# Patient Record
Sex: Female | Born: 1950 | Race: White | Hispanic: No | Marital: Married | Smoking: Former smoker
Health system: Southern US, Community
[De-identification: ages and names within clinical notes are randomized; demographics above are authoritative.]

## PROBLEM LIST (undated history)

## (undated) HISTORY — PX: CHOLECYSTECTOMY: SHX55

---

## 2013-08-10 ENCOUNTER — Other Ambulatory Visit: Payer: Self-pay | Admitting: Gynecology

## 2013-08-10 DIAGNOSIS — R928 Other abnormal and inconclusive findings on diagnostic imaging of breast: Secondary | ICD-10-CM

## 2013-08-20 ENCOUNTER — Ambulatory Visit
Admission: RE | Admit: 2013-08-20 | Discharge: 2013-08-20 | Disposition: A | Discharge status: Home / Self Care | Payer: No Typology Code available for payment source | Source: Ambulatory Visit | Attending: Gynecology | Admitting: Gynecology

## 2013-08-20 DIAGNOSIS — R928 Other abnormal and inconclusive findings on diagnostic imaging of breast: Secondary | ICD-10-CM

## 2014-03-18 ENCOUNTER — Other Ambulatory Visit: Payer: Self-pay | Admitting: Gynecology

## 2014-03-18 DIAGNOSIS — N6489 Other specified disorders of breast: Secondary | ICD-10-CM

## 2014-04-06 ENCOUNTER — Ambulatory Visit
Admission: RE | Admit: 2014-04-06 | Discharge: 2014-04-06 | Disposition: A | Discharge status: Home / Self Care | Payer: Medicare Other | Source: Ambulatory Visit | Attending: Gynecology | Admitting: Gynecology

## 2014-04-06 DIAGNOSIS — N6489 Other specified disorders of breast: Secondary | ICD-10-CM

## 2014-08-03 ENCOUNTER — Other Ambulatory Visit: Payer: Self-pay | Admitting: Gynecology

## 2014-08-03 DIAGNOSIS — N6489 Other specified disorders of breast: Secondary | ICD-10-CM

## 2014-08-12 ENCOUNTER — Ambulatory Visit
Admission: RE | Admit: 2014-08-12 | Discharge: 2014-08-12 | Disposition: A | Discharge status: Home / Self Care | Payer: Medicare Other | Source: Ambulatory Visit | Attending: Gynecology | Admitting: Gynecology

## 2014-08-12 DIAGNOSIS — N6489 Other specified disorders of breast: Secondary | ICD-10-CM

## 2014-11-22 ENCOUNTER — Encounter (HOSPITAL_COMMUNITY)
Admission: RE | Admit: 2014-11-22 | Discharge: 2014-11-22 | Disposition: A | Discharge status: Home / Self Care | Payer: Medicare Other | Source: Ambulatory Visit | Attending: Obstetrics and Gynecology | Admitting: Obstetrics and Gynecology

## 2014-11-22 ENCOUNTER — Encounter (HOSPITAL_COMMUNITY): Payer: Self-pay

## 2014-11-22 DIAGNOSIS — N8189 Other female genital prolapse: Secondary | ICD-10-CM | POA: Diagnosis not present

## 2014-11-22 DIAGNOSIS — Z01818 Encounter for other preprocedural examination: Secondary | ICD-10-CM | POA: Diagnosis not present

## 2014-11-22 LAB — COMPREHENSIVE METABOLIC PANEL
ALBUMIN: 4.4 g/dL (ref 3.5–5.0)
ALK PHOS: 60 U/L (ref 38–126)
ALT: 15 U/L (ref 14–54)
ANION GAP: 9 (ref 5–15)
AST: 21 U/L (ref 15–41)
BILIRUBIN TOTAL: 0.6 mg/dL (ref 0.3–1.2)
BUN: 26 mg/dL — ABNORMAL HIGH (ref 6–20)
CHLORIDE: 103 mmol/L (ref 101–111)
CO2: 26 mmol/L (ref 22–32)
Calcium: 9.6 mg/dL (ref 8.9–10.3)
Creatinine, Ser: 1.14 mg/dL — ABNORMAL HIGH (ref 0.44–1.00)
GFR calc Af Amer: 57 mL/min — ABNORMAL LOW (ref >60)
GFR, EST NON AFRICAN AMERICAN: 49 mL/min — AB (ref >60)
GLUCOSE: 120 mg/dL — AB (ref 65–99)
Potassium: 4.7 mmol/L (ref 3.5–5.1)
Sodium: 138 mmol/L (ref 135–145)
TOTAL PROTEIN: 7.6 g/dL (ref 6.5–8.1)

## 2014-11-22 LAB — TYPE AND SCREEN
ABO/RH(D): A POS
Antibody Screen: NEGATIVE

## 2014-11-22 LAB — CBC
HEMATOCRIT: 36.8 % (ref 36.0–46.0)
Hemoglobin: 12.1 g/dL (ref 12.0–15.0)
MCH: 30.6 pg (ref 26.0–34.0)
MCHC: 32.9 g/dL (ref 30.0–36.0)
MCV: 92.9 fL (ref 78.0–100.0)
Platelets: 275 10*3/uL (ref 150–400)
RBC: 3.96 MIL/uL (ref 3.87–5.11)
RDW: 13.3 % (ref 11.5–15.5)
WBC: 5.7 10*3/uL (ref 4.0–10.5)

## 2014-11-22 LAB — ABO/RH: ABO/RH(D): A POS

## 2014-11-22 NOTE — Patient Instructions (Signed)
Your procedure is scheduled on:11/29/14  Enter through the Main Entrance at :6am Pick up desk phone and dial [phone] and inform us of your arrival.  Please call [phone] if you have any problems the morning of surgery.  Remember: Do not eat food or drink liquids, including water, after midnight:Sunday 11/28/14   You may brush your teeth the morning of surgery.   DO NOT wear jewelry, eye make-up, lipstick,body lotion, or dark fingernail polish.  (Polished toes are ok) You may wear deodorant.  If you are to be admitted after surgery, leave suitcase in car until your room has been assigned. Patients discharged on the day of surgery will not be allowed to drive home. Wear loose fitting, comfortable clothes for your ride home.

## 2014-11-23 NOTE — Anesthesia Preprocedure Evaluation (Addendum)
Anesthesia Evaluation  Patient identified by MRN, date of birth, ID band Patient awake    Reviewed: Allergy & Precautions, NPO status , Patient's Chart, lab work & pertinent test results, reviewed documented beta blocker date and time   Airway Mallampati: II   Neck ROM: Limited    Dental  (+) Partial Upper, Dental Advisory Given   Pulmonary neg pulmonary ROS,  breath sounds clear to auscultation        Cardiovascular negative cardio ROS  Rhythm:Regular     Neuro/Psych negative neurological ROS  negative psych ROS   GI/Hepatic negative GI ROS, Neg liver ROS,   Endo/Other  negative endocrine ROS  Renal/GU negative Renal ROS     Musculoskeletal   Abdominal (+) - obese,  Abdomen: soft.    Peds  Hematology 12/36   Anesthesia Other Findings   Reproductive/Obstetrics                            Anesthesia Physical Anesthesia Plan  ASA: I  Anesthesia Plan: General   Post-op Pain Management:    Induction: Intravenous  Airway Management Planned: Oral ETT  Additional Equipment:   Intra-op Plan:   Post-operative Plan: Extubation in OR  Informed Consent: I have reviewed the patients History and Physical, chart, labs and discussed the procedure including the risks, benefits and alternatives for the proposed anesthesia with the patient or authorized representative who has indicated his/her understanding and acceptance.     Plan Discussed with:   Anesthesia Plan Comments:         Anesthesia Quick Evaluation

## 2014-11-26 NOTE — H&P (Signed)
Kristen Richards is an 64 y.o. female with symptomatic pelvic prolapse. C/O vaginal pressure. Urodynamics with pessary in place C/W GSUI.   Pertinent Gynecological History: Menses: post-menopausal Bleeding: N/A Contraception: none DES exposure: denies Blood transfusions: none Sexually transmitted diseases: no past history Previous GYN Procedures: none  Last mammogram: normal Date: 2016 Last pap: normal Date: 2015 OB History: G5, P3   Menstrual History: Menarche age: unknown  No LMP recorded.    No past medical history on file.  Past Surgical History  Procedure Laterality Date  . Cholecystectomy      No family history on file.  Social History:  reports that she has never smoked. She does not have any smokeless tobacco history on file. She reports that she drinks alcohol. Her drug history is not on file.  Allergies:  Allergies  Allergen Reactions  . Demerol [Meperidine] Anaphylaxis  . Codeine Nausea Only    No prescriptions prior to admission    Review of Systems  Constitutional: Negative for fever.    There were no vitals taken for this visit. Physical Exam  Cardiovascular: Normal rate and regular rhythm.   Respiratory: Effort normal and breath sounds normal.  GI: Soft. There is no tenderness.  Genitourinary:  Uterus normal size, prolapse to lower 1/3 of vagina Cystocele just inside introitus Lax rectovaginal septum Adnexa NT without masses    No results found for this or any previous visit (from the past 24 hour(s)).  No results found.  Assessment/Plan: 64 yo G5P3 with symptomatic pelvic prolapse and GSUI.  D/W patient LAVH/BSO/A&P repair/SSLS and TOT Risks reviewed including infection, organ damage, bleeding/transfusion-HIV/Hep, DVT/PE, pneumonia, fistula, laparotomy, return to OR, pelvic pain, abdominal pain, painful intercourse, vaginal narrowing, success/failure of sling, recurrence of incontinence, urethral obstruction and prolonged catheterization,  erosion, painful intercourse, return to OR. Patient states she understands and agrees. All questions answered.  Kristen Richards,Kristen Richards 11/26/2014, 1:26 PM

## 2014-11-29 ENCOUNTER — Ambulatory Visit (HOSPITAL_COMMUNITY): Payer: Medicare Other | Admitting: Anesthesiology

## 2014-11-29 ENCOUNTER — Observation Stay (HOSPITAL_COMMUNITY)
Admission: RE | Admit: 2014-11-29 | Discharge: 2014-11-30 | Disposition: A | Discharge status: Home / Self Care | Payer: Medicare Other | Source: Ambulatory Visit | Attending: Obstetrics and Gynecology | Admitting: Obstetrics and Gynecology

## 2014-11-29 ENCOUNTER — Encounter (HOSPITAL_COMMUNITY)
Admission: RE | Disposition: A | Discharge status: Home / Self Care | Payer: Self-pay | Source: Ambulatory Visit | Attending: Obstetrics and Gynecology

## 2014-11-29 ENCOUNTER — Encounter (HOSPITAL_COMMUNITY): Payer: Self-pay | Admitting: Emergency Medicine

## 2014-11-29 DIAGNOSIS — N393 Stress incontinence (female) (male): Secondary | ICD-10-CM | POA: Insufficient documentation

## 2014-11-29 DIAGNOSIS — Z23 Encounter for immunization: Secondary | ICD-10-CM | POA: Insufficient documentation

## 2014-11-29 DIAGNOSIS — N812 Incomplete uterovaginal prolapse: Secondary | ICD-10-CM | POA: Diagnosis present

## 2014-11-29 DIAGNOSIS — N888 Other specified noninflammatory disorders of cervix uteri: Secondary | ICD-10-CM | POA: Insufficient documentation

## 2014-11-29 DIAGNOSIS — Z79899 Other long term (current) drug therapy: Secondary | ICD-10-CM | POA: Diagnosis not present

## 2014-11-29 DIAGNOSIS — N838 Other noninflammatory disorders of ovary, fallopian tube and broad ligament: Secondary | ICD-10-CM | POA: Insufficient documentation

## 2014-11-29 DIAGNOSIS — D270 Benign neoplasm of right ovary: Secondary | ICD-10-CM | POA: Insufficient documentation

## 2014-11-29 DIAGNOSIS — Z885 Allergy status to narcotic agent status: Secondary | ICD-10-CM | POA: Diagnosis not present

## 2014-11-29 DIAGNOSIS — N8189 Other female genital prolapse: Secondary | ICD-10-CM | POA: Diagnosis present

## 2014-11-29 HISTORY — PX: PUBOVAGINAL SLING: SHX1035

## 2014-11-29 HISTORY — PX: LAPAROSCOPIC ASSISTED VAGINAL HYSTERECTOMY: SHX5398

## 2014-11-29 HISTORY — PX: SALPINGOOPHORECTOMY: SHX82

## 2014-11-29 HISTORY — PX: ANTERIOR AND POSTERIOR REPAIR WITH SACROSPINOUS FIXATION: SHX6536

## 2014-11-29 SURGERY — HYSTERECTOMY, VAGINAL, LAPAROSCOPY-ASSISTED
Anesthesia: General

## 2014-11-29 MED ORDER — MENTHOL 3 MG MT LOZG: 1.0000 | LOZENGE | OROMUCOSAL | Status: DC | PRN

## 2014-11-29 MED ORDER — MAGNESIUM HYDROXIDE 400 MG/5ML PO SUSP: 30.0000 mL | Freq: Every day | ORAL | Status: DC | PRN

## 2014-11-29 MED ORDER — CEFAZOLIN SODIUM-DEXTROSE 2-3 GM-% IV SOLR
2.0000 g | INTRAVENOUS | Status: AC
  Administered 2014-11-29: 2 g via INTRAVENOUS

## 2014-11-29 MED ORDER — NEOSTIGMINE METHYLSULFATE 10 MG/10ML IV SOLN
INTRAVENOUS | Status: DC | PRN
  Administered 2014-11-29: 2 mg via INTRAVENOUS

## 2014-11-29 MED ORDER — ONDANSETRON HCL 4 MG/2ML IJ SOLN
INTRAMUSCULAR | Status: DC | PRN
  Administered 2014-11-29: 4 mg via INTRAVENOUS

## 2014-11-29 MED ORDER — LACTATED RINGERS IV SOLN
INTRAVENOUS | Status: DC
  Administered 2014-11-29 (×3): via INTRAVENOUS

## 2014-11-29 MED ORDER — ONDANSETRON HCL 4 MG/2ML IJ SOLN: 4.0000 mg | Freq: Four times a day (QID) | INTRAMUSCULAR | Status: DC | PRN

## 2014-11-29 MED ORDER — BUPIVACAINE HCL (PF) 0.5 % IJ SOLN
INTRAMUSCULAR | Status: AC
  Filled 2014-11-29: qty 30

## 2014-11-29 MED ORDER — DEXAMETHASONE SODIUM PHOSPHATE 10 MG/ML IJ SOLN
INTRAMUSCULAR | Status: DC | PRN
  Administered 2014-11-29: 4 mg via INTRAVENOUS

## 2014-11-29 MED ORDER — ESTRADIOL 0.1 MG/GM VA CREA
TOPICAL_CREAM | VAGINAL | Status: DC | PRN
  Administered 2014-11-29: 1 via VAGINAL

## 2014-11-29 MED ORDER — METHYLENE BLUE 1 % INJ SOLN
INTRAMUSCULAR | Status: AC
  Filled 2014-11-29: qty 1

## 2014-11-29 MED ORDER — PROPOFOL 10 MG/ML IV BOLUS
INTRAVENOUS | Status: DC | PRN
  Administered 2014-11-29: 160 mg via INTRAVENOUS

## 2014-11-29 MED ORDER — ESTRADIOL 0.1 MG/GM VA CREA
TOPICAL_CREAM | VAGINAL | Status: AC
  Filled 2014-11-29: qty 42.5

## 2014-11-29 MED ORDER — SENNA 8.6 MG PO TABS
1.0000 | ORAL_TABLET | Freq: Two times a day (BID) | ORAL | Status: DC
  Administered 2014-11-30: 8.6 mg via ORAL
  Filled 2014-11-29 (×4): qty 1

## 2014-11-29 MED ORDER — FENTANYL CITRATE (PF) 250 MCG/5ML IJ SOLN
INTRAMUSCULAR | Status: AC
  Filled 2014-11-29: qty 5

## 2014-11-29 MED ORDER — PNEUMOCOCCAL VAC POLYVALENT 25 MCG/0.5ML IJ INJ
0.5000 mL | INJECTION | INTRAMUSCULAR | Status: AC
  Administered 2014-11-30: 0.5 mL via INTRAMUSCULAR
  Filled 2014-11-29: qty 0.5

## 2014-11-29 MED ORDER — HYDROMORPHONE 0.3 MG/ML IV SOLN
INTRAVENOUS | Status: DC
  Administered 2014-11-29: 0.999 mg via INTRAVENOUS
  Administered 2014-11-29: 12:00:00 via INTRAVENOUS
  Administered 2014-11-29: 0.599 mg via INTRAVENOUS
  Administered 2014-11-30: 0.8 mg via INTRAVENOUS
  Administered 2014-11-30: 0.799 mg via INTRAVENOUS
  Filled 2014-11-29: qty 25

## 2014-11-29 MED ORDER — ONDANSETRON HCL 4 MG PO TABS: 4.0000 mg | ORAL_TABLET | Freq: Four times a day (QID) | ORAL | Status: DC | PRN

## 2014-11-29 MED ORDER — SIMETHICONE 80 MG PO CHEW: 80.0000 mg | CHEWABLE_TABLET | Freq: Four times a day (QID) | ORAL | Status: DC | PRN

## 2014-11-29 MED ORDER — ROCURONIUM BROMIDE 100 MG/10ML IV SOLN
INTRAVENOUS | Status: AC
  Filled 2014-11-29: qty 1

## 2014-11-29 MED ORDER — ZOLPIDEM TARTRATE 5 MG PO TABS: 5.0000 mg | ORAL_TABLET | Freq: Every evening | ORAL | Status: DC | PRN

## 2014-11-29 MED ORDER — LACTATED RINGERS IV SOLN
INTRAVENOUS | Status: DC
  Administered 2014-11-29 (×2): via INTRAVENOUS

## 2014-11-29 MED ORDER — HEPARIN SODIUM (PORCINE) 5000 UNIT/ML IJ SOLN
INTRAMUSCULAR | Status: AC
  Filled 2014-11-29: qty 1

## 2014-11-29 MED ORDER — DIPHENHYDRAMINE HCL 50 MG/ML IJ SOLN: 12.5000 mg | Freq: Four times a day (QID) | INTRAMUSCULAR | Status: DC | PRN

## 2014-11-29 MED ORDER — SCOPOLAMINE 1 MG/3DAYS TD PT72
MEDICATED_PATCH | TRANSDERMAL | Status: AC
  Administered 2014-11-29: 1.5 mg via TRANSDERMAL
  Filled 2014-11-29: qty 1

## 2014-11-29 MED ORDER — FENTANYL CITRATE (PF) 100 MCG/2ML IJ SOLN
INTRAMUSCULAR | Status: AC
  Administered 2014-11-29: 25 ug via INTRAVENOUS
  Filled 2014-11-29: qty 2

## 2014-11-29 MED ORDER — ONDANSETRON HCL 4 MG/2ML IJ SOLN
INTRAMUSCULAR | Status: AC
  Filled 2014-11-29: qty 2

## 2014-11-29 MED ORDER — ACETAMINOPHEN 325 MG PO TABS
650.0000 mg | ORAL_TABLET | ORAL | Status: DC | PRN
  Administered 2014-11-30: 650 mg via ORAL
  Filled 2014-11-29: qty 2

## 2014-11-29 MED ORDER — ROCURONIUM BROMIDE 100 MG/10ML IV SOLN
INTRAVENOUS | Status: DC | PRN
  Administered 2014-11-29: 10 mg via INTRAVENOUS
  Administered 2014-11-29: 30 mg via INTRAVENOUS

## 2014-11-29 MED ORDER — SUCCINYLCHOLINE CHLORIDE 20 MG/ML IJ SOLN
INTRAMUSCULAR | Status: AC
  Filled 2014-11-29: qty 1

## 2014-11-29 MED ORDER — TRAMADOL HCL 50 MG PO TABS
50.0000 mg | ORAL_TABLET | Freq: Four times a day (QID) | ORAL | Status: DC | PRN
  Administered 2014-11-30: 50 mg via ORAL
  Filled 2014-11-29: qty 1

## 2014-11-29 MED ORDER — ACETAMINOPHEN 10 MG/ML IV SOLN
1000.0000 mg | Freq: Once | INTRAVENOUS | Status: AC
  Administered 2014-11-29: 1000 mg via INTRAVENOUS
  Filled 2014-11-29: qty 100

## 2014-11-29 MED ORDER — DEXAMETHASONE SODIUM PHOSPHATE 4 MG/ML IJ SOLN
INTRAMUSCULAR | Status: AC
  Filled 2014-11-29: qty 1

## 2014-11-29 MED ORDER — LIDOCAINE HCL (CARDIAC) 20 MG/ML IV SOLN
INTRAVENOUS | Status: AC
  Filled 2014-11-29: qty 5

## 2014-11-29 MED ORDER — LIDOCAINE-EPINEPHRINE 0.5 %-1:200000 IJ SOLN
INTRAMUSCULAR | Status: AC
  Filled 2014-11-29: qty 1

## 2014-11-29 MED ORDER — CEFAZOLIN SODIUM-DEXTROSE 2-3 GM-% IV SOLR
INTRAVENOUS | Status: AC
  Filled 2014-11-29: qty 50

## 2014-11-29 MED ORDER — PROPOFOL 10 MG/ML IV BOLUS
INTRAVENOUS | Status: AC
  Filled 2014-11-29: qty 20

## 2014-11-29 MED ORDER — KETOROLAC TROMETHAMINE 30 MG/ML IJ SOLN
INTRAMUSCULAR | Status: AC
  Filled 2014-11-29: qty 1

## 2014-11-29 MED ORDER — HYDROMORPHONE 0.3 MG/ML IV SOLN: INTRAVENOUS | Status: DC

## 2014-11-29 MED ORDER — NALOXONE HCL 0.4 MG/ML IJ SOLN: 0.4000 mg | INTRAMUSCULAR | Status: DC | PRN

## 2014-11-29 MED ORDER — PROMETHAZINE HCL 25 MG/ML IJ SOLN: 6.2500 mg | INTRAMUSCULAR | Status: DC | PRN

## 2014-11-29 MED ORDER — FENTANYL CITRATE (PF) 100 MCG/2ML IJ SOLN
INTRAMUSCULAR | Status: DC | PRN
  Administered 2014-11-29 (×2): 50 ug via INTRAVENOUS
  Administered 2014-11-29 (×2): 25 ug via INTRAVENOUS
  Administered 2014-11-29: 50 ug via INTRAVENOUS

## 2014-11-29 MED ORDER — SODIUM CHLORIDE 0.9 % IJ SOLN: 9.0000 mL | INTRAMUSCULAR | Status: DC | PRN

## 2014-11-29 MED ORDER — BUPIVACAINE HCL (PF) 0.5 % IJ SOLN
INTRAMUSCULAR | Status: DC | PRN
  Administered 2014-11-29: 24 mL
  Administered 2014-11-29: 6 mL

## 2014-11-29 MED ORDER — LIDOCAINE-EPINEPHRINE 0.5 %-1:200000 IJ SOLN
INTRAMUSCULAR | Status: DC | PRN
  Administered 2014-11-29: 10 mL
  Administered 2014-11-29: 20 mL

## 2014-11-29 MED ORDER — GLYCOPYRROLATE 0.2 MG/ML IJ SOLN
INTRAMUSCULAR | Status: DC | PRN
  Administered 2014-11-29: 0.4 mg via INTRAVENOUS

## 2014-11-29 MED ORDER — SCOPOLAMINE 1 MG/3DAYS TD PT72
1.0000 | MEDICATED_PATCH | Freq: Once | TRANSDERMAL | Status: DC
  Administered 2014-11-29: 1.5 mg via TRANSDERMAL

## 2014-11-29 MED ORDER — DIPHENHYDRAMINE HCL 12.5 MG/5ML PO ELIX
12.5000 mg | ORAL_SOLUTION | Freq: Four times a day (QID) | ORAL | Status: DC | PRN
  Filled 2014-11-29: qty 5

## 2014-11-29 MED ORDER — FENTANYL CITRATE (PF) 100 MCG/2ML IJ SOLN
25.0000 ug | INTRAMUSCULAR | Status: DC | PRN
  Administered 2014-11-29 (×4): 25 ug via INTRAVENOUS

## 2014-11-29 MED ORDER — LIDOCAINE HCL (CARDIAC) 20 MG/ML IV SOLN
INTRAVENOUS | Status: DC | PRN
  Administered 2014-11-29 (×2): 50 mg via INTRAVENOUS

## 2014-11-29 MED ORDER — SODIUM CHLORIDE 0.9 % IJ SOLN
INTRAMUSCULAR | Status: AC
  Filled 2014-11-29: qty 100

## 2014-11-29 SURGICAL SUPPLY — 69 items
BLADE SURG 11 STRL SS (BLADE) ×4 IMPLANT
BLADE SURG 15 STRL LF C SS BP (BLADE) ×2 IMPLANT
BLADE SURG 15 STRL SS (BLADE) ×2
CABLE HIGH FREQUENCY MONO STRZ (ELECTRODE) IMPLANT
CATH ROBINSON RED A/P 16FR (CATHETERS) ×4 IMPLANT
CLOTH BEACON ORANGE TIMEOUT ST (SAFETY) ×4 IMPLANT
CONT PATH 16OZ SNAP LID 3702 (MISCELLANEOUS) ×4 IMPLANT
COVER BACK TABLE 60X90IN (DRAPES) ×4 IMPLANT
DECANTER SPIKE VIAL GLASS SM (MISCELLANEOUS) ×4 IMPLANT
DEVICE CAPIO SLIM SINGLE (INSTRUMENTS) ×4 IMPLANT
DISSECTOR SPONGE CHERRY (GAUZE/BANDAGES/DRESSINGS) IMPLANT
DRSG COVADERM PLUS 2X2 (GAUZE/BANDAGES/DRESSINGS) ×8 IMPLANT
DRSG OPSITE POSTOP 3X4 (GAUZE/BANDAGES/DRESSINGS) ×4 IMPLANT
DURAPREP 26ML APPLICATOR (WOUND CARE) ×4 IMPLANT
ELECT LIGASURE LONG (ELECTRODE) ×4 IMPLANT
ELECT REM PT RETURN 9FT ADLT (ELECTROSURGICAL)
ELECTRODE REM PT RTRN 9FT ADLT (ELECTROSURGICAL) IMPLANT
FILTER SMOKE EVAC LAPAROSHD (FILTER) ×4 IMPLANT
GAUZE PACKING 1 X5 YD ST (GAUZE/BANDAGES/DRESSINGS) ×8 IMPLANT
GAUZE SPONGE 4X4 16PLY XRAY LF (GAUZE/BANDAGES/DRESSINGS) ×4 IMPLANT
GLOVE BIO SURGEON STRL SZ7.5 (GLOVE) ×8 IMPLANT
GLOVE BIOGEL PI IND STRL 6.5 (GLOVE) ×2 IMPLANT
GLOVE BIOGEL PI IND STRL 8 (GLOVE) ×2 IMPLANT
GLOVE BIOGEL PI INDICATOR 6.5 (GLOVE) ×2
GLOVE BIOGEL PI INDICATOR 8 (GLOVE) ×2
GOWN STRL REUS W/ TWL XL LVL3 (GOWN DISPOSABLE) ×4 IMPLANT
GOWN STRL REUS W/TWL LRG LVL3 (GOWN DISPOSABLE) ×16 IMPLANT
GOWN STRL REUS W/TWL XL LVL3 (GOWN DISPOSABLE) ×4
LIQUID BAND (GAUZE/BANDAGES/DRESSINGS) ×4 IMPLANT
NEEDLE HYPO 22GX1.5 SAFETY (NEEDLE) ×4 IMPLANT
NEEDLE HYPO 25X5/8 SAFETYGLIDE (NEEDLE) ×4 IMPLANT
NEEDLE INSUFFLATION 120MM (ENDOMECHANICALS) ×4 IMPLANT
NEEDLE MAYO .5 CIRCLE (NEEDLE) ×4 IMPLANT
NEEDLE SPNL 22GX3.5 QUINCKE BK (NEEDLE) IMPLANT
NS IRRIG 1000ML POUR BTL (IV SOLUTION) ×4 IMPLANT
PACK LAVH (CUSTOM PROCEDURE TRAY) ×4 IMPLANT
PACK ROBOTIC GOWN (GOWN DISPOSABLE) ×4 IMPLANT
PACK VAGINAL WOMENS (CUSTOM PROCEDURE TRAY) ×4 IMPLANT
PAD POSITIONER PINK NONSTERILE (MISCELLANEOUS) ×4 IMPLANT
PLUG CATH AND CAP STER (CATHETERS) ×4 IMPLANT
SCISSORS LAP 5X45 EPIX DISP (ENDOMECHANICALS) IMPLANT
SEALER TISSUE G2 CVD JAW 45CM (ENDOMECHANICALS) ×4 IMPLANT
SET CYSTO W/LG BORE CLAMP LF (SET/KITS/TRAYS/PACK) ×4 IMPLANT
SET IRRIG TUBING LAPAROSCOPIC (IRRIGATION / IRRIGATOR) IMPLANT
SLING HALO OBTRYX (Sling) ×2 IMPLANT
SLING HALO OBTRYX NDL (Sling) ×2 IMPLANT
SUT CAPIO POLYGLYCOLIC (SUTURE) ×4 IMPLANT
SUT MNCRL 0 MO-4 VIOLET 18 CR (SUTURE) ×4 IMPLANT
SUT MNCRL 0 VIOLET 6X18 (SUTURE) ×2 IMPLANT
SUT MON AB 2-0 CT1 27 (SUTURE) ×8 IMPLANT
SUT MONOCRYL 0 6X18 (SUTURE) ×2
SUT MONOCRYL 0 MO 4 18  CR/8 (SUTURE) ×4
SUT PLAIN 2 0 (SUTURE)
SUT PLAIN ABS 2-0 54XMFL TIE (SUTURE) IMPLANT
SUT VIC AB 2-0 CT1 27 (SUTURE) ×2
SUT VIC AB 2-0 CT1 TAPERPNT 27 (SUTURE) ×2 IMPLANT
SUT VIC AB 2-0 CT2 27 (SUTURE) ×12 IMPLANT
SUT VIC AB 2-0 UR6 27 (SUTURE) ×8 IMPLANT
SUT VIC AB 4-0 PS2 27 (SUTURE) ×4 IMPLANT
SUT VICRYL 0 UR6 27IN ABS (SUTURE) IMPLANT
SUT VICRYL 4-0 PS2 18IN ABS (SUTURE) ×4 IMPLANT
SYR 30ML LL (SYRINGE) ×4 IMPLANT
SYRINGE 10CC LL (SYRINGE) ×4 IMPLANT
TOWEL OR 17X24 6PK STRL BLUE (TOWEL DISPOSABLE) ×8 IMPLANT
TRAY FOLEY CATH SILVER 14FR (SET/KITS/TRAYS/PACK) ×4 IMPLANT
TROCAR XCEL NON-BLD 11X100MML (ENDOMECHANICALS) ×4 IMPLANT
TROCAR XCEL NON-BLD 5MMX100MML (ENDOMECHANICALS) ×4 IMPLANT
WARMER LAPAROSCOPE (MISCELLANEOUS) ×4 IMPLANT
WATER STERILE IRR 1000ML POUR (IV SOLUTION) ×4 IMPLANT

## 2014-11-29 NOTE — Transfer of Care (Signed)
Immediate Anesthesia Transfer of Care Note  Patient: Kristen Richards  Procedure(s) Performed: Procedure(s): LAPAROSCOPIC ASSISTED VAGINAL HYSTERECTOMY (N/A) SALPINGO OOPHORECTOMY (Bilateral) ANTERIOR AND POSTERIOR REPAIR WITH SACROSPINOUS LIGAMENT SUSPENSION (N/A) TRANSOBTURATOR SLING  (N/A)  Patient Location: PACU  Anesthesia Type:General  Level of Consciousness: awake  Airway & Oxygen Therapy: Patient Spontanous Breathing  Post-op Assessment: Report given to PACU RN  Post vital signs: stable  Filed Vitals:   11/29/14 0603  BP: 122/66  Pulse: 82  Temp: 36.4 C  Resp: 16    Complications: No apparent anesthesia complications

## 2014-11-29 NOTE — Progress Notes (Signed)
No changes to H&P per patient history Reviewed with patient procedure-LAVH/BSO/A&P repair/SSLS/TOT All questions answered Patient states she understands and agrees.

## 2014-11-29 NOTE — Anesthesia Procedure Notes (Signed)
Procedure Name: Intubation Date/Time: 11/29/2014 7:31 AM Performed by: Casimer Lanius A Pre-anesthesia Checklist: Patient identified, Emergency Drugs available, Suction available and Patient being monitored Patient Re-evaluated:Patient Re-evaluated prior to inductionOxygen Delivery Method: Circle system utilized and Simple face mask Preoxygenation: Pre-oxygenation with 100% oxygen Intubation Type: IV induction Ventilation: Mask ventilation without difficulty Laryngoscope Size: Mac and 3 Grade View: Grade II Tube type: Oral Tube size: 7.0 mm Number of attempts: 1 Airway Equipment and Method: Stylet Placement Confirmation: ETT inserted through vocal cords under direct vision,  positive ETCO2 and breath sounds checked- equal and bilateral Secured at: 21 (right lip) cm Tube secured with: Tape Dental Injury: Teeth and Oropharynx as per pre-operative assessment

## 2014-11-29 NOTE — Brief Op Note (Signed)
11/29/2014  9:47 AM  PATIENT:  Casimer Lanius  64 y.o. female  PRE-OPERATIVE DIAGNOSIS:  pelvic prolapse  POST-OPERATIVE DIAGNOSIS:  pelvic prolapse  PROCEDURE:  Procedure(s): LAPAROSCOPIC ASSISTED VAGINAL HYSTERECTOMY (N/A) SALPINGO OOPHORECTOMY (Bilateral) ANTERIOR AND POSTERIOR REPAIR WITH SACROSPINOUS LIGAMENT SUSPENSION (N/A) TRANSOBTURATOR SLING  (N/A)cystocscoy  SURGEON:  Surgeon(s) and Role:    * Everlene Farrier, MD - Primary  PHYSICIAN ASSISTANT: [place]  ASSISTANTS: none   ANESTHESIA:   general  EBL:  Total I/O In: 1600 [I.V.:1600] Out: 195 [Urine:100; Blood:95]  BLOOD ADMINISTERED:none  DRAINS: Urinary Catheter (Foley)   LOCAL MEDICATIONS USED:  MARCAINE    and LIDOCAINE   SPECIMEN:  Source of Specimen:  uterus ,bilateral tubes and ovaries  DISPOSITION OF SPECIMEN:  PATHOLOGY  COUNTS:  YES  TOURNIQUET:  * No tourniquets in log *  DICTATION: .Other Dictation: Dictation Number [id number]  PLAN OF CARE: Admit for overnight observation  PATIENT DISPOSITION:  PACU - hemodynamically stable.   Delay start of Pharmacological VTE agent (>24hrs) due to surgical blood loss or risk of bleeding: not applicable

## 2014-11-29 NOTE — Op Note (Signed)
NAME:  Kristen Richards, MCMEEKIN NO.:  [id number]  MEDICAL RECORD NO.:  [id number]  LOCATION:  [facility]                          FACILITY:  [facility]  PHYSICIAN:  Daleen Bo. Gaetano Net, M.D. DATE OF BIRTH:  06-Jun-1950  DATE OF PROCEDURE:  11/29/2014 DATE OF DISCHARGE:                              OPERATIVE REPORT   PREOPERATIVE DIAGNOSES: 1. Pelvic relaxation. 2. Genuine stress urinary incontinence.  POSTOPERATIVE DIAGNOSES: 1. Pelvic relaxation. 2. Genuine stress urinary continence.  SURGEON:  Daleen Bo. Gaetano Net, MD  ANESTHESIA:  General with endotracheal intubation.  ASSISTANT:  Molli Posey, MD  ESTIMATED BLOOD LOSS:  100 mL.  PROCEDURE:  Laparoscopically-assisted vaginal hysterectomy with bilateral salpingo-oophorectomy, anterior and posterior vaginal repair, sacrospinous ligament suspension and transobturator mid urethral sling with cystoscopy.  SPECIMENS:  Uterus, bilateral fallopian tubes, and ovaries to Pathology.  INDICATIONS AND CONSENT:  This patient is a 64 year old patient with symptomatic pelvic relaxation.  She also has stress incontinence. Laparoscopically assisted vaginal hysterectomy with bilateral salpingo- oophorectomy, anterior-posterior vaginal repair, sacrospinous ligament suspension, transobturator mid urethral sling has been discussed preoperatively.  Potential risks and complications were reviewed preoperatively including, but not limited to, infection, organ damage, bleeding requiring transfusion of blood products with HIV, and hepatitis acquisition, DVT, PE, pneumonia, laparotomy, return to the operating room, pelvic pain, abdominal pain, painful intercourse, groin and leg pain, recurrent urinary continence, urethral obstruction, prolonged catheterizations, overactive bladder symptoms, painful intercourse, erosion.  All questions have been answered and consent was signed on the chart.  FINDINGS:  Upper abdomen is grossly normal.  Uterus is  normal size. Anterior posterior cul-de-sacs were clean.  Tubes and ovaries were normal bilaterally.  PROCEDURE IN DETAIL:  The patient was taken to the operating room.  She was placed in a dorsal supine position.  General anesthesia was induced via endotracheal intubation.  She was placed in a dorsal lithotomy position.  Time-out was undertaken.  She was prepped abdominally and vaginally.  Hulka tenaculum was placed, uterus as a Counselling psychologist.  She was straight catheterized and draped in a sterile fashion.  The infraumbilical and suprapubic areas were then injected with Marcaine. Approximately 6 mL was used.  A small infraumbilical incision was made and a disposable Veress needle was placed on the first attempt without difficulty.  Good syringe and drop test were noted.  2 L of gas were then insufflated under low pressure with good tympany in the right upper quadrant.  Veress needle was removed.  A 10/11 XL bladeless disposable trocar sleeve was then placed using direct visualization with the diagnostic scope.  Following that, the operative scope was used.  A small suprapubic incision was made in the midline and a 5-mm disposable trocar sleeve was placed under direct visualization without difficulty. The above findings were noted.  Then, using the Uphold bipolar cautery cutting instrument, the infundibulopelvic ligaments were taken down well to clear the pelvic sidewall.  This was done on the right side coming across through the round ligament down the level of vesicouterine peritoneum.  Similar procedure was carried out on the left side. Anterior vesicouterine peritoneum was taken down as well.  Good hemostasis was noted.  Instruments were removed and  attention was turned to the vagina.  Posterior cul-de-sac was entered sharply and the cervix was circumscribed with unipolar cautery.  Mucosa was advanced sharply and bluntly.  Then, using the handheld LigaSure bipolar cautery, progressive  bites were taken of the bladder pillars, cardinal ligaments and uterine vessels bilaterally.  The fundus with tubes and ovaries were delivered posteriorly allowing good visualization for taking down the anterior peritoneum.  This fully delivers the specimen.  Suture will be 0 Monocryl unless otherwise designated.  Uterosacral ligaments were plicated the cuff bilaterally and then plicated the midline with a third suture.  Two-thirds of the cuff was closed leaving the small portion of the anterior open to facilitate the anterior repair.  The anterior vaginal mucosa was then injected with a dilute solution of lidocaine with epinephrine, 30 mL diluted in 50 mL of saline.  The anterior vaginal mucosa was then taken down the midline to a point about 3-4 cm below the urethral meatus.  The vaginal mucosa was then widely dissected bilaterally, sharply, and bluntly from the underlying bladder.  The bladder was then reduced with a purse-string suture.  The vesicovaginal tissues were then reapproximated in the midline as well to further support the bladder.  Excess mucosa was trimmed.  The anterior vaginal cuff was closed with a 0 Monocryl suture and then the remainder of the anterior vaginal mucosa was closed in a locking running fashion with a 2- 0 Monocryl suture.  The posterior vaginal mucosa was then widely injected with the same dilute solution.  A small superficial transverse incision was then made at the 6 o'clock in the vaginal introitus, in the mucosa to facilitate the midline incision in the posterior vaginal mucosa which was extended along the lower 2/3rd of the length of the posterior vaginal wall.  The mucosa was then dissected from the underlying bladder sharply and bluntly bilaterally.  Dissection was further carried out to identify and palpate the right ischial spine and sacrospinous ligament.  Then, using the Capio needle driver, a Vicryl suture was placed through the sacrospinous  ligament, at least 1 fingerbreadth medial to the spine.  This was then taken through the posterior aspect of the vaginal cuff under good visualization and the sutures were held.  The rectovaginal fascia was then reapproximated in midline with interrupted 0 Monocryl sutures.  The sacrospinous ligament was then tied down which does a good job of elevating the vaginal apex. Small amount of excess vaginal mucosa was trimmed and the posterior vaginal mucosa was closed in a running, locking fashion with 2-0 Monocryl suture.  A single suture was used of 0 Monocryl to reapproximate the posterior perineal body.  Care was taken not to carry these sutures too wide.  The posterior vaginal mucosa closure was then carried down in a standard episiotomy fashion to completely close the posterior incision.  Good hemostasis was noted.  Attention was then turned to the sling.  The points for the bilateral inguinal incisions just inferior to the adductor tendon were marked bilaterally.  Then injected with the same dilute solution used on the vaginal mucosa.  The Foley catheter was placed in the bladder.  The bladder was drained and the Foley was left in place.  The vaginal mucosa below the urethra was also similarly injected.  A small midline incision was made below the course of the urethra and dissection was carried out bilaterally, bluntly to the sidewalls.  Then, using the Halo needles, they were passed bilaterally with the passage of the  needle tip guided by the examining finger through the vaginal incision extending them below the urethra bilaterally.  Foley catheter was then removed and cystoscopy was carried out with a 70-degree cystoscope.  A 360-degree inspection reveals the bladder to be intact.  There are no foreign bodies and a good puff of urine from the ureters bilaterally.  Cystoscope was removed.  Foley was replaced.  The bladder was drained and the Foley was left in place.  The polypropylene  mesh sling was then placed on the needle tips, which were then withdrawn back through the skin incisions. The sheath was then removed intact bilaterally.  Care was taken that the sling is lying flat with no rolls or kinks.  A Kelly placed under the sling can be rotated perpendicular to the floor with 0 tension.  There was a small gap noted between the urethra and the sling.  Excess sling was trimmed at the level of the skin bilaterally.  The vaginal incision was closed with a running locking 2-0 Vicryl suture.  A vaginal pack with Estrace vaginal cream was then placed in the vagina.  Attention was returned to the vagina.  Thorough inspection revealed excellent hemostasis.  Remaining approximately 25 mL of 0.5% plain Marcaine was then injected into peritoneal cavity.  Suprapubic trocar sleeve was removed.  Pneumoperitoneum was reduced and the umbilical trocar sleeve was removed.  Umbilical incision was closed with a 0 Vicryl in the anterior fascia under good visualization and skin incisions on both were closed with interrupted 2-0 Vicryl.  A glue was applied to both.  All counts were correct.  The patient was awakened and taken to recovery room in stable condition.     Daleen Bo Gaetano Net, M.D.     JET/MEDQ  D:  11/29/2014  T:  11/29/2014  Job:  [id number]

## 2014-11-29 NOTE — Progress Notes (Signed)
Good pain relief, no nausea  VSS Afeb UO clear  Lungs CTA Cor RRR Abd soft, good BS Ext PAS on  Stable Per orders

## 2014-11-29 NOTE — Anesthesia Postprocedure Evaluation (Signed)
  Anesthesia Post-op Note  Patient: Kristen Richards  Procedure(s) Performed: Procedure(s): LAPAROSCOPIC ASSISTED VAGINAL HYSTERECTOMY (N/A) SALPINGO OOPHORECTOMY (Bilateral) ANTERIOR AND POSTERIOR REPAIR WITH SACROSPINOUS LIGAMENT SUSPENSION (N/A) TRANSOBTURATOR SLING  (N/A)  Patient Location: PACU  Anesthesia Type:General  Level of Consciousness: awake, alert  and oriented  Airway and Oxygen Therapy: Patient Spontanous Breathing and Patient connected to nasal cannula oxygen  Post-op Pain: mild  Post-op Assessment: Post-op Vital signs reviewed, Patient's Cardiovascular Status Stable, Respiratory Function Stable, Patent Airway and No signs of Nausea or vomiting              Post-op Vital Signs: Reviewed and stable  Last Vitals:  Filed Vitals:   11/29/14 0603  BP: 122/66  Pulse: 82  Temp: 36.4 C  Resp: 16    Complications: No apparent anesthesia complications

## 2014-11-30 DIAGNOSIS — N812 Incomplete uterovaginal prolapse: Secondary | ICD-10-CM | POA: Diagnosis not present

## 2014-11-30 LAB — CBC
HEMATOCRIT: 31.3 % — AB (ref 36.0–46.0)
Hemoglobin: 10 g/dL — ABNORMAL LOW (ref 12.0–15.0)
MCH: 30.2 pg (ref 26.0–34.0)
MCHC: 31.9 g/dL (ref 30.0–36.0)
MCV: 94.6 fL (ref 78.0–100.0)
Platelets: 207 10*3/uL (ref 150–400)
RBC: 3.31 MIL/uL — ABNORMAL LOW (ref 3.87–5.11)
RDW: 13.5 % (ref 11.5–15.5)
WBC: 12.5 10*3/uL — AB (ref 4.0–10.5)

## 2014-11-30 MED ORDER — TRAMADOL HCL 50 MG PO TABS
50.0000 mg | ORAL_TABLET | ORAL | Status: DC | PRN
  Administered 2014-11-30: 50 mg via ORAL
  Filled 2014-11-30: qty 1

## 2014-11-30 NOTE — Discharge Summary (Signed)
Physician Discharge Summary  Patient ID: Kristen Richards MRN: [id number] DOB/AGE: 1950/10/06 64 y.o.  Admit date: 11/29/2014 Discharge date: 11/30/2014  Admission Diagnoses:pelvic relaxation, genuine stress urinary incontinence  Discharge Diagnoses:  Active Problems:   Pelvic relaxation genuine stress urinary incontinence  Discharged Condition: good  Hospital Course: good postoperative resumption of bowel and bladder function. Residual urine volumes of 58 and 0 cc. Ambulating well with good pain relief.  Consults: None  Significant Diagnostic Studies: labs:  Results for orders placed or performed during the hospital encounter of 11/29/14 (from the past 24 hour(s))  CBC     Status: Abnormal   Collection Time: 11/30/14  5:04 AM  Result Value Ref Range   WBC 12.5 (H) 4.0 - 10.5 K/uL   RBC 3.31 (L) 3.87 - 5.11 MIL/uL   Hemoglobin 10.0 (L) 12.0 - 15.0 g/dL   HCT 31.3 (L) 36.0 - 46.0 %   MCV 94.6 78.0 - 100.0 fL   MCH 30.2 26.0 - 34.0 pg   MCHC 31.9 30.0 - 36.0 g/dL   RDW 13.5 11.5 - 15.5 %   Platelets 207 150 - 400 K/uL    Treatments: surgery: LAVH/BSO/A&P repair/SSLS/TOT  Discharge Exam: Blood pressure 106/59, pulse 63, temperature 97.5 F (36.4 C), temperature source Oral, resp. rate 16, height 5\' 5"  (1.651 m), weight 155 lb (70.308 kg), SpO2 98 %. General appearance: alert, cooperative and no distress GI: soft, non-tender; bowel sounds normal; no masses,  no organomegaly  Disposition: Final discharge disposition not confirmed     Medication List    STOP taking these medications        CALCIUM PO     cholecalciferol 1000 UNITS tablet  Commonly known as:  VITAMIN D           Follow-up Information    Follow up In 2 weeks.      Signed: Benjiman Core E 11/30/2014, 1:34 PM

## 2014-11-30 NOTE — Discharge Instructions (Signed)
No vaginal entry No heavy lifting No operation of automobiles

## 2014-11-30 NOTE — Progress Notes (Signed)
Foley catheter d/c'd at 5:30am.  IVF d/c'd at same time.  1st void at 11:00am 69ml with 57ml on bladder scan.    Ultram 50mg  given at 6:30am and pt is requesting more medication now.  Phoned Dr. Gaetano Net with above voiding trial information and with request to modify pain medication regimen.  See new order for Ultram frequency.

## 2014-11-30 NOTE — Progress Notes (Signed)
POD #1 Good pain relief, tolerating regular diet, + flatus, ambulating, voiding  VSS Afeb Residual urine  58cc and 0 cc Abdomen soft, good BS Ext NT  Results for orders placed or performed during the hospital encounter of 11/29/14 (from the past 24 hour(s))  CBC     Status: Abnormal   Collection Time: 11/30/14  5:04 AM  Result Value Ref Range   WBC 12.5 (H) 4.0 - 10.5 K/uL   RBC 3.31 (L) 3.87 - 5.11 MIL/uL   Hemoglobin 10.0 (L) 12.0 - 15.0 g/dL   HCT 31.3 (L) 36.0 - 46.0 %   MCV 94.6 78.0 - 100.0 fL   MCH 30.2 26.0 - 34.0 pg   MCHC 31.9 30.0 - 36.0 g/dL   RDW 13.5 11.5 - 15.5 %   Platelets 207 150 - 400 K/uL   A: Satisfactory  P: D/C home     Instructions given     Has Robaxin and narcotic at home

## 2014-11-30 NOTE — Progress Notes (Signed)
Pt discharged to home with husband.  Condition stable.  Pt to car via wheelchair with Astrid Divine, NT.  No equipment for home ordered at discharge.

## 2014-12-01 ENCOUNTER — Encounter (HOSPITAL_COMMUNITY): Payer: Self-pay | Admitting: Obstetrics and Gynecology

## 2015-08-29 ENCOUNTER — Other Ambulatory Visit: Payer: Self-pay

## 2015-08-29 DIAGNOSIS — Z1231 Encounter for screening mammogram for malignant neoplasm of breast: Secondary | ICD-10-CM

## 2015-09-08 ENCOUNTER — Ambulatory Visit
Admission: RE | Admit: 2015-09-08 | Discharge: 2015-09-08 | Disposition: A | Discharge status: Home / Self Care | Payer: Medicare Other | Source: Ambulatory Visit

## 2015-09-08 DIAGNOSIS — Z1231 Encounter for screening mammogram for malignant neoplasm of breast: Secondary | ICD-10-CM

## 2016-08-07 ENCOUNTER — Other Ambulatory Visit: Payer: Self-pay | Admitting: Obstetrics and Gynecology

## 2016-08-07 DIAGNOSIS — Z1231 Encounter for screening mammogram for malignant neoplasm of breast: Secondary | ICD-10-CM

## 2016-09-13 ENCOUNTER — Ambulatory Visit
Admission: RE | Admit: 2016-09-13 | Discharge: 2016-09-13 | Disposition: A | Discharge status: Home / Self Care | Payer: Medicare HMO | Source: Ambulatory Visit | Attending: Obstetrics and Gynecology | Admitting: Obstetrics and Gynecology

## 2016-09-13 DIAGNOSIS — Z1231 Encounter for screening mammogram for malignant neoplasm of breast: Secondary | ICD-10-CM

## 2017-08-19 ENCOUNTER — Other Ambulatory Visit: Payer: Self-pay | Admitting: Obstetrics and Gynecology

## 2017-08-19 DIAGNOSIS — Z1231 Encounter for screening mammogram for malignant neoplasm of breast: Secondary | ICD-10-CM

## 2017-08-22 ENCOUNTER — Encounter: Payer: Self-pay | Admitting: Family Medicine

## 2017-09-17 ENCOUNTER — Ambulatory Visit
Admission: RE | Admit: 2017-09-17 | Discharge: 2017-09-17 | Disposition: A | Discharge status: Home / Self Care | Payer: Medicare HMO | Source: Ambulatory Visit | Attending: Obstetrics and Gynecology | Admitting: Obstetrics and Gynecology

## 2017-09-17 DIAGNOSIS — Z1231 Encounter for screening mammogram for malignant neoplasm of breast: Secondary | ICD-10-CM

## 2018-08-28 DIAGNOSIS — N819 Female genital prolapse, unspecified: Secondary | ICD-10-CM | POA: Insufficient documentation

## 2018-08-28 DIAGNOSIS — M810 Age-related osteoporosis without current pathological fracture: Secondary | ICD-10-CM | POA: Insufficient documentation

## 2018-10-30 ENCOUNTER — Other Ambulatory Visit: Payer: Self-pay | Admitting: Obstetrics and Gynecology

## 2018-10-30 DIAGNOSIS — Z1231 Encounter for screening mammogram for malignant neoplasm of breast: Secondary | ICD-10-CM

## 2018-12-17 ENCOUNTER — Other Ambulatory Visit: Payer: Self-pay

## 2018-12-17 ENCOUNTER — Ambulatory Visit
Admission: RE | Admit: 2018-12-17 | Discharge: 2018-12-17 | Disposition: A | Discharge status: Home / Self Care | Payer: Medicare HMO | Source: Ambulatory Visit | Attending: Obstetrics and Gynecology | Admitting: Obstetrics and Gynecology

## 2018-12-17 DIAGNOSIS — Z1231 Encounter for screening mammogram for malignant neoplasm of breast: Secondary | ICD-10-CM

## 2019-07-03 ENCOUNTER — Other Ambulatory Visit: Payer: Self-pay

## 2019-07-03 ENCOUNTER — Ambulatory Visit (INDEPENDENT_AMBULATORY_CARE_PROVIDER_SITE_OTHER): Payer: Medicare HMO | Admitting: Family Medicine

## 2019-07-03 ENCOUNTER — Encounter: Payer: Self-pay | Admitting: Family Medicine

## 2019-07-03 VITALS — BP 120/72 | HR 80 | Temp 97.8°F | Resp 12 | Ht 64.5 in | Wt 164.0 lb

## 2019-07-03 DIAGNOSIS — E663 Overweight: Secondary | ICD-10-CM

## 2019-07-03 DIAGNOSIS — R944 Abnormal results of kidney function studies: Secondary | ICD-10-CM

## 2019-07-03 DIAGNOSIS — Z7689 Persons encountering health services in other specified circumstances: Secondary | ICD-10-CM

## 2019-07-03 DIAGNOSIS — M8580 Other specified disorders of bone density and structure, unspecified site: Secondary | ICD-10-CM | POA: Diagnosis not present

## 2019-07-03 LAB — BASIC METABOLIC PANEL
BUN: 33 mg/dL — ABNORMAL HIGH (ref 6–23)
CO2: 27 mEq/L (ref 19–32)
Calcium: 9.8 mg/dL (ref 8.4–10.5)
Chloride: 103 mEq/L (ref 96–112)
Creatinine, Ser: 1.4 mg/dL — ABNORMAL HIGH (ref 0.40–1.20)
GFR: 37.15 mL/min — ABNORMAL LOW (ref >60.00)
Glucose, Bld: 92 mg/dL (ref 70–99)
Potassium: 4.4 mEq/L (ref 3.5–5.1)
Sodium: 139 mEq/L (ref 135–145)

## 2019-07-03 LAB — VITAMIN D 25 HYDROXY (VIT D DEFICIENCY, FRACTURES): VITD: 67.26 ng/mL (ref 30.00–100.00)

## 2019-07-03 LAB — LIPID PANEL
Cholesterol: 214 mg/dL — ABNORMAL HIGH (ref 0–200)
HDL: 64.1 mg/dL (ref >39.00)
LDL Cholesterol: 135 mg/dL — ABNORMAL HIGH (ref 0–99)
NonHDL: 150.38
Total CHOL/HDL Ratio: 3
Triglycerides: 77 mg/dL (ref 0.0–149.0)
VLDL: 15.4 mg/dL (ref 0.0–40.0)

## 2019-07-03 NOTE — Patient Instructions (Addendum)
Vaccines I recommend-  Tdap- at pharmacy Shingrix- at pharmacy Prevnar 13- can have in the office at a later date  Follow up in 1 year, sooner if you need anything

## 2019-07-03 NOTE — Progress Notes (Signed)
   Subjective:    Patient ID: Kristen Richards, female    DOB: 15-Jun-1950, 69 y.o.   MRN: [id number]  HPI Chief Complaint  Patient presents with  . Establish Care    previous PCP Dr Brunetta Genera. Also GYN Dr. Gaetano Net   Lives with her husband. Enjoys reading and exercise. 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 No health concerns today.   Last CPE- sees gyn, no recent labs Mammo- regular Pap- hysterectomy  Colonoscopy- cologuard 10/20 Tdap- not in last 10 years Flu- annual Eye-  regular Dental- regular Exercise- club fitness, going to classes   Review of Systems Denies visual changes, headaches, chest pain, SOB, muscle aches/ pains    Objective:   Physical Exam Physical Exam  Constitutional: Oriented to person, place, and time. She appears well-developed and well-nourished.  HENT:  Head: Normocephalic and atraumatic.  Eyes: Conjunctivae are normal.  Neck: Normal range of motion. Neck supple.  Cardiovascular: Normal rate, regular rhythm and normal heart sounds.   Pulmonary/Chest: Effort normal and breath sounds normal.  Musculoskeletal: Normal range of motion.  Neurological: Alert and oriented to person, place, and time.  Skin: Skin is warm and dry.  Psychiatric: Normal mood and affect. Behavior is normal. Judgment and thought content normal.  Vitals reviewed.     BP 120/72   Pulse 80   Temp 97.8 F (36.6 C)   Resp 12   Ht 5' 4.5" (1.638 m)   Wt 164 lb (74.4 kg)   BMI 27.72 kg/m      Assessment & Plan:  1. Encounter to establish care - reviewed available EMR records, will request records from gyn - reviewed overdue health maintence  2. Osteopenia, unspecified location - will get prior dexa reports from gyn - VITAMIN D 25 Hydroxy (Vit-D Deficiency, Fractures)  3. Overweight - Basic Metabolic Panel - VITAMIN D 25 Hydroxy (Vit-D Deficiency, Fractures) - Lipid Panel  - follow up in 1 year Clarene Reamer, FNP-BC  [facility]  07/03/2019 10:30 AM

## 2019-07-06 NOTE — Addendum Note (Signed)
Addended by: Clarene Reamer B on: 07/06/2019 08:58 AM   Modules accepted: Orders

## 2019-07-22 ENCOUNTER — Other Ambulatory Visit: Payer: Self-pay

## 2019-07-22 ENCOUNTER — Other Ambulatory Visit (INDEPENDENT_AMBULATORY_CARE_PROVIDER_SITE_OTHER): Payer: Medicare HMO

## 2019-07-22 DIAGNOSIS — R944 Abnormal results of kidney function studies: Secondary | ICD-10-CM | POA: Diagnosis not present

## 2019-07-22 LAB — BASIC METABOLIC PANEL
BUN: 22 mg/dL (ref 6–23)
CO2: 28 mEq/L (ref 19–32)
Calcium: 9.2 mg/dL (ref 8.4–10.5)
Chloride: 104 mEq/L (ref 96–112)
Creatinine, Ser: 1.24 mg/dL — ABNORMAL HIGH (ref 0.40–1.20)
GFR: 42.73 mL/min — ABNORMAL LOW (ref >60.00)
Glucose, Bld: 98 mg/dL (ref 70–99)
Potassium: 4.9 mEq/L (ref 3.5–5.1)
Sodium: 138 mEq/L (ref 135–145)

## 2019-07-23 NOTE — Addendum Note (Signed)
Addended by: Clarene Reamer B on: 07/23/2019 08:57 AM   Modules accepted: Orders

## 2019-09-14 ENCOUNTER — Encounter: Payer: Self-pay | Admitting: Family Medicine

## 2019-10-27 ENCOUNTER — Other Ambulatory Visit: Payer: Self-pay

## 2019-10-27 ENCOUNTER — Other Ambulatory Visit (INDEPENDENT_AMBULATORY_CARE_PROVIDER_SITE_OTHER): Payer: Medicare HMO

## 2019-10-27 DIAGNOSIS — R944 Abnormal results of kidney function studies: Secondary | ICD-10-CM

## 2019-10-27 LAB — BASIC METABOLIC PANEL
BUN: 24 mg/dL — ABNORMAL HIGH (ref 6–23)
CO2: 27 mEq/L (ref 19–32)
Calcium: 9.4 mg/dL (ref 8.4–10.5)
Chloride: 105 mEq/L (ref 96–112)
Creatinine, Ser: 1.21 mg/dL — ABNORMAL HIGH (ref 0.40–1.20)
GFR: 43.92 mL/min — ABNORMAL LOW (ref >60.00)
Glucose, Bld: 97 mg/dL (ref 70–99)
Potassium: 4.4 mEq/L (ref 3.5–5.1)
Sodium: 141 mEq/L (ref 135–145)

## 2019-10-28 NOTE — Addendum Note (Signed)
Addended by: Clarene Reamer B on: 10/28/2019 07:56 AM   Modules accepted: Orders

## 2019-11-19 ENCOUNTER — Other Ambulatory Visit: Payer: Self-pay | Admitting: Obstetrics and Gynecology

## 2019-11-19 DIAGNOSIS — Z1231 Encounter for screening mammogram for malignant neoplasm of breast: Secondary | ICD-10-CM

## 2019-12-18 ENCOUNTER — Other Ambulatory Visit: Payer: Self-pay

## 2019-12-18 ENCOUNTER — Ambulatory Visit
Admission: RE | Admit: 2019-12-18 | Discharge: 2019-12-18 | Disposition: A | Discharge status: Home / Self Care | Payer: Medicare HMO | Source: Ambulatory Visit | Attending: Obstetrics and Gynecology | Admitting: Obstetrics and Gynecology

## 2019-12-18 DIAGNOSIS — Z1231 Encounter for screening mammogram for malignant neoplasm of breast: Secondary | ICD-10-CM

## 2020-01-27 ENCOUNTER — Other Ambulatory Visit: Payer: Medicare HMO

## 2020-02-08 ENCOUNTER — Other Ambulatory Visit: Payer: Self-pay

## 2020-02-08 ENCOUNTER — Other Ambulatory Visit (INDEPENDENT_AMBULATORY_CARE_PROVIDER_SITE_OTHER): Payer: Medicare HMO

## 2020-02-08 DIAGNOSIS — R944 Abnormal results of kidney function studies: Secondary | ICD-10-CM | POA: Diagnosis not present

## 2020-02-08 DIAGNOSIS — N261 Atrophy of kidney (terminal): Secondary | ICD-10-CM

## 2020-02-08 LAB — BASIC METABOLIC PANEL
BUN: 25 mg/dL — ABNORMAL HIGH (ref 6–23)
CO2: 25 mEq/L (ref 19–32)
Calcium: 9.1 mg/dL (ref 8.4–10.5)
Chloride: 104 mEq/L (ref 96–112)
Creatinine, Ser: 1.25 mg/dL — ABNORMAL HIGH (ref 0.40–1.20)
GFR: 42.27 mL/min — ABNORMAL LOW (ref >60.00)
Glucose, Bld: 97 mg/dL (ref 70–99)
Potassium: 4.7 mEq/L (ref 3.5–5.1)
Sodium: 138 mEq/L (ref 135–145)

## 2020-02-10 NOTE — Addendum Note (Signed)
Addended by: Clarene Reamer B on: 02/10/2020 02:06 PM   Modules accepted: Orders

## 2020-02-19 ENCOUNTER — Ambulatory Visit
Admission: RE | Admit: 2020-02-19 | Discharge: 2020-02-19 | Disposition: A | Discharge status: Home / Self Care | Payer: Medicare HMO | Source: Ambulatory Visit | Attending: Family Medicine | Admitting: Family Medicine

## 2020-02-19 DIAGNOSIS — R944 Abnormal results of kidney function studies: Secondary | ICD-10-CM

## 2020-02-22 ENCOUNTER — Encounter: Payer: Self-pay | Admitting: Family Medicine

## 2020-02-22 NOTE — Telephone Encounter (Signed)
Please see message. °

## 2020-02-22 NOTE — Addendum Note (Signed)
Addended by: Clarene Reamer B on: 02/22/2020 08:30 AM   Modules accepted: Orders

## 2020-02-25 NOTE — Telephone Encounter (Signed)
Cancelled.  

## 2020-11-16 ENCOUNTER — Other Ambulatory Visit: Payer: Self-pay | Admitting: Obstetrics and Gynecology

## 2020-11-16 DIAGNOSIS — Z1231 Encounter for screening mammogram for malignant neoplasm of breast: Secondary | ICD-10-CM

## 2021-01-18 ENCOUNTER — Ambulatory Visit
Admission: RE | Admit: 2021-01-18 | Discharge: 2021-01-18 | Disposition: A | Discharge status: Home / Self Care | Payer: Medicare HMO | Source: Ambulatory Visit | Attending: Obstetrics and Gynecology | Admitting: Obstetrics and Gynecology

## 2021-01-18 ENCOUNTER — Other Ambulatory Visit: Payer: Self-pay

## 2021-01-18 DIAGNOSIS — Z1231 Encounter for screening mammogram for malignant neoplasm of breast: Secondary | ICD-10-CM

## 2022-11-08 IMAGING — MG MM DIGITAL SCREENING BILAT W/ TOMO AND CAD
8 series · 8 of 24 positions shown · non-contrast
Comparison: Previous exam(s).

CLINICAL DATA: Screening.

EXAM:
DIGITAL SCREENING BILATERAL MAMMOGRAM WITH TOMOSYNTHESIS AND CAD
TECHNIQUE: Bilateral screening digital craniocaudal and mediolateral oblique
mammograms were obtained. Bilateral screening digital breast
tomosynthesis was performed. The images were evaluated with
computer-aided detection.

[L MLO synth-2D]
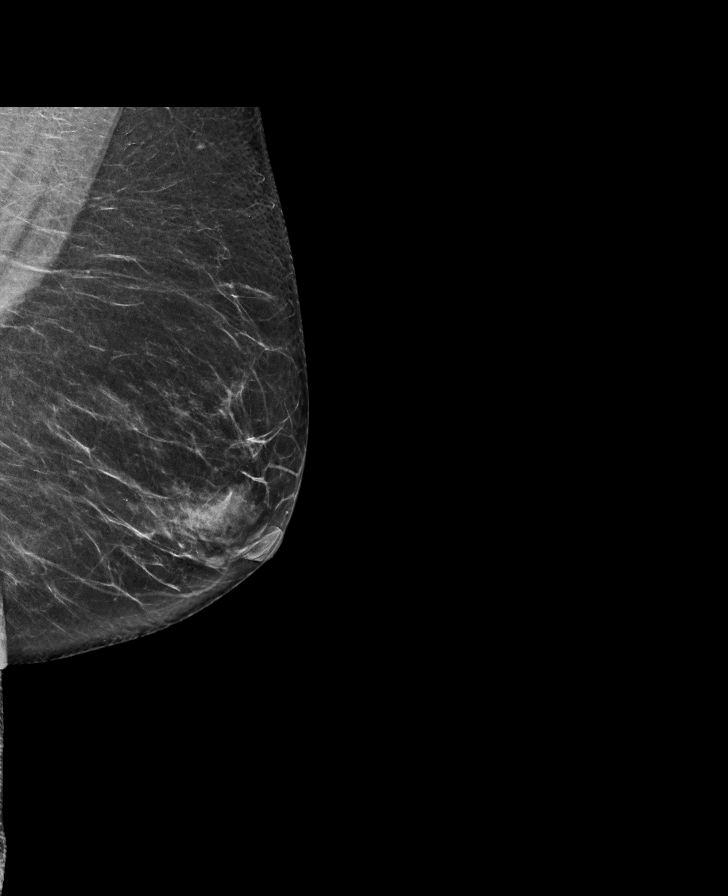

[R MLO synth-2D]
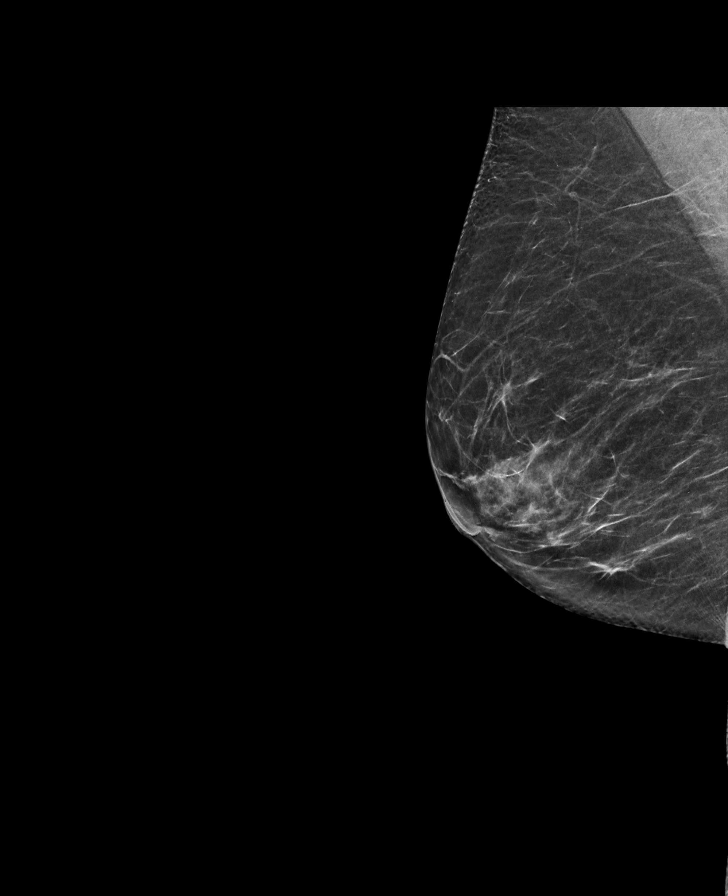

[L CC synth-2D]
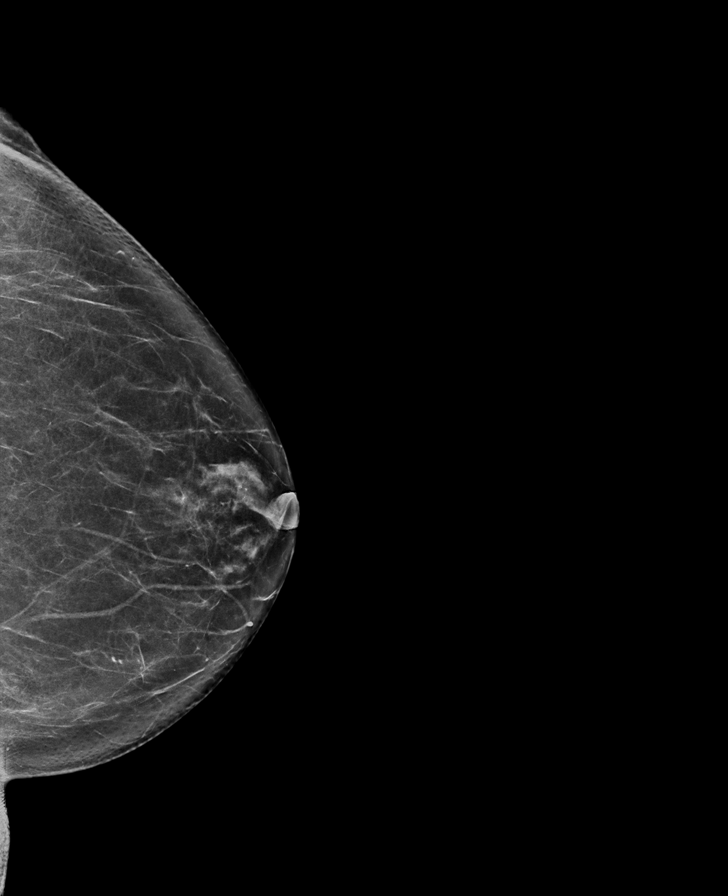

[R CC synth-2D]
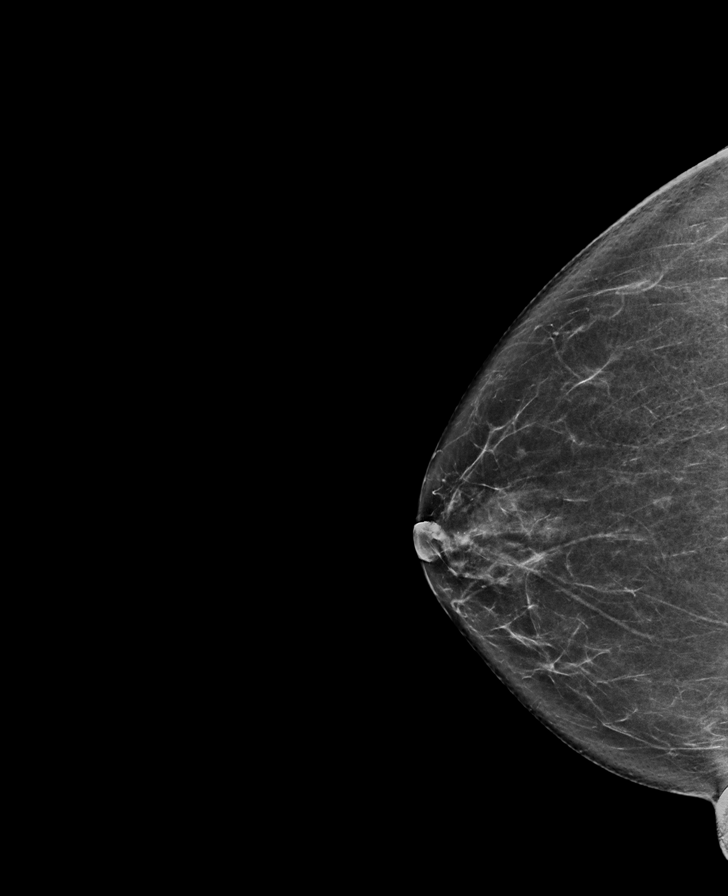

[R CC tomo · tomo slice 35/68.0]
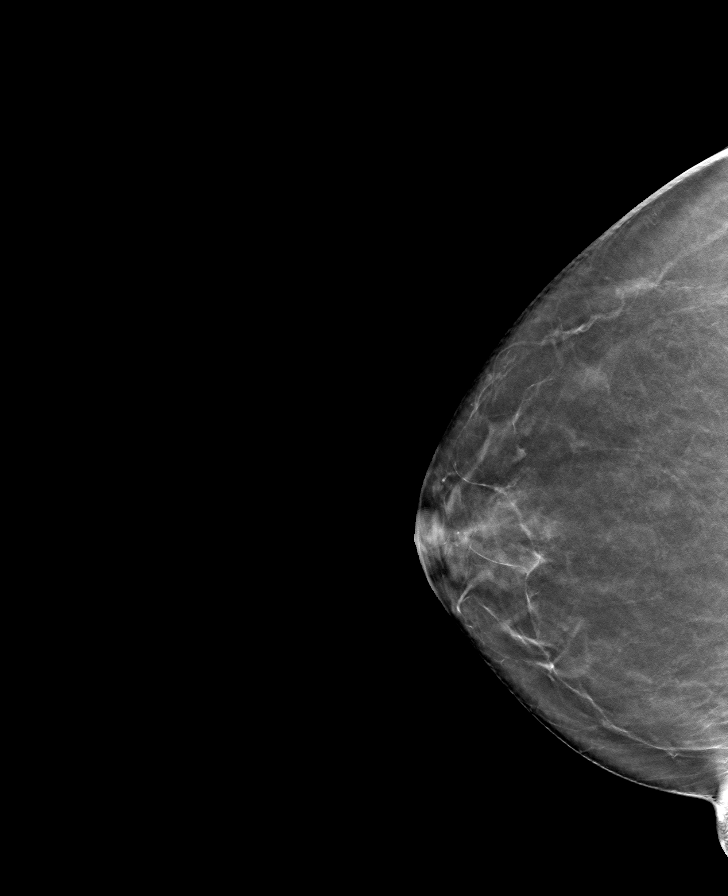

[R MLO tomo · tomo slice 31/62.0]
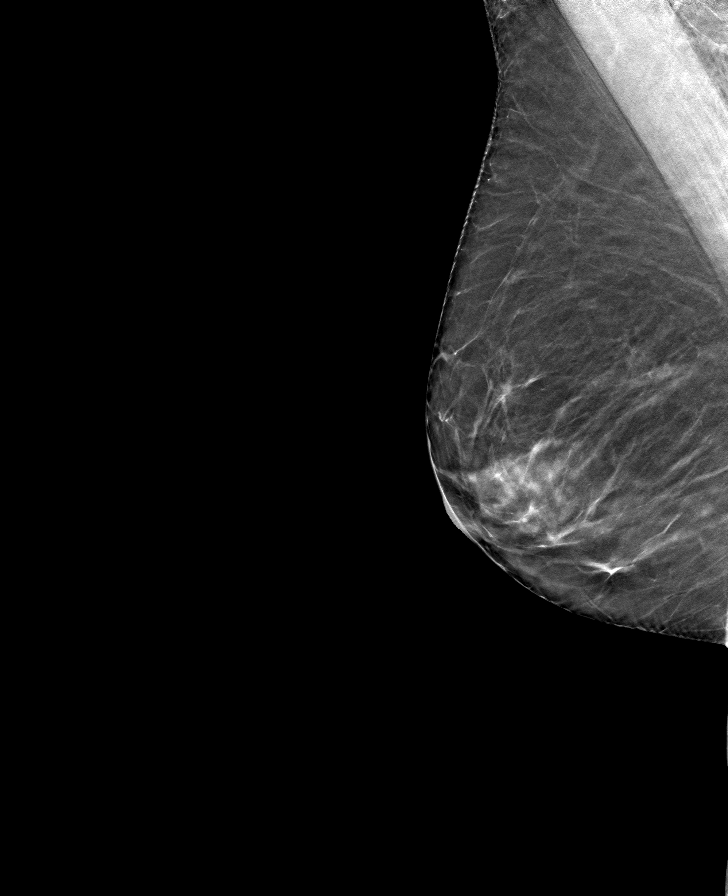

[L CC tomo · tomo slice 37/72.0]
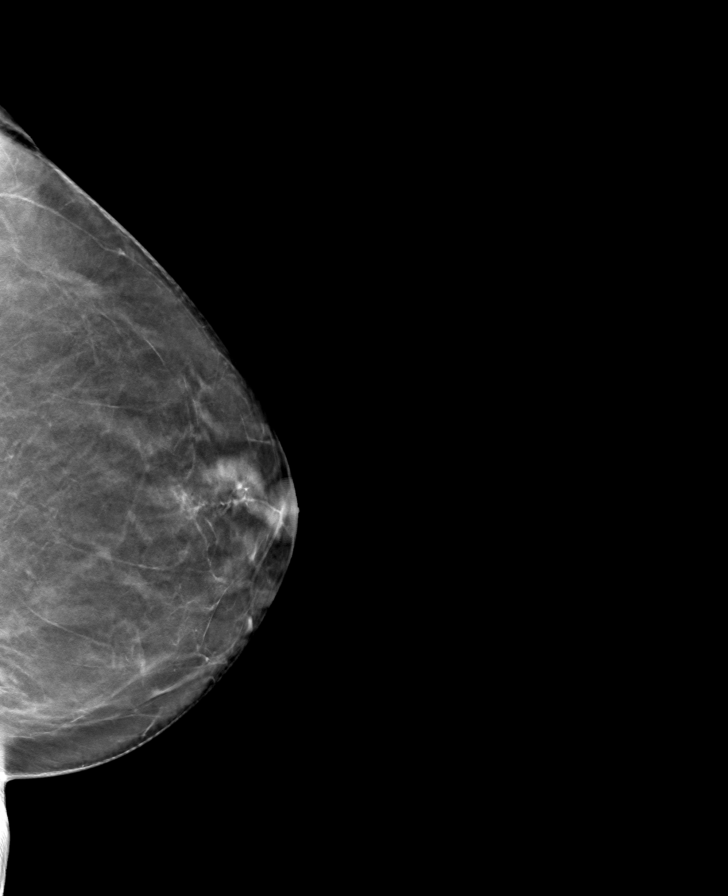

[L MLO tomo · tomo slice 36/71.0]
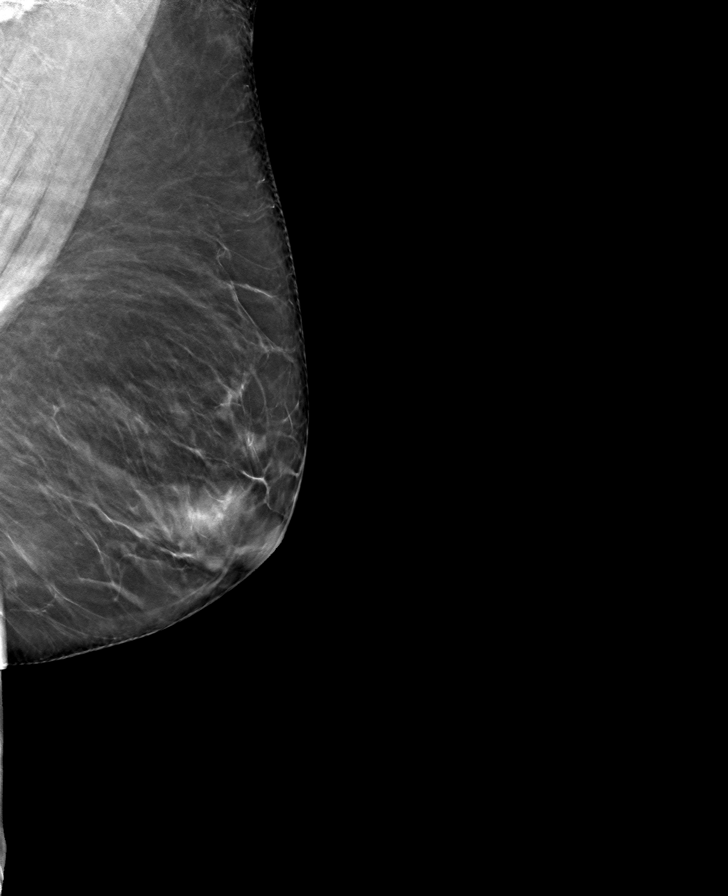

[8 of 24 positions shown; findings below may reference images not displayed]

ACR Breast Density Category b: There are scattered areas of
fibroglandular density.
FINDINGS: There are no findings suspicious for malignancy.
IMPRESSION: No mammographic evidence of malignancy. A result letter of this
screening mammogram will be mailed directly to the patient.

RECOMMENDATION:
Screening mammogram in one year. (Code:51-O-LD2)

BI-RADS CATEGORY  1: Negative.
